# Patient Record
Sex: Male | Born: 1980 | Race: White | Hispanic: No | Marital: Single | Smoking: Current every day smoker
Health system: Southern US, Community
[De-identification: ages and names within clinical notes are randomized; demographics above are authoritative.]

## PROBLEM LIST (undated history)

## (undated) DIAGNOSIS — K589 Irritable bowel syndrome without diarrhea: Secondary | ICD-10-CM

## (undated) DIAGNOSIS — I10 Essential (primary) hypertension: Secondary | ICD-10-CM

---

## 2004-05-10 ENCOUNTER — Emergency Department: Payer: Self-pay | Admitting: Emergency Medicine

## 2004-05-16 ENCOUNTER — Emergency Department: Payer: Self-pay | Admitting: Emergency Medicine

## 2004-06-16 ENCOUNTER — Emergency Department: Payer: Self-pay | Admitting: General Practice

## 2004-11-30 ENCOUNTER — Emergency Department: Payer: Self-pay | Admitting: Emergency Medicine

## 2005-02-02 ENCOUNTER — Emergency Department: Payer: Self-pay | Admitting: Emergency Medicine

## 2005-03-08 ENCOUNTER — Emergency Department: Payer: Self-pay | Admitting: Emergency Medicine

## 2005-03-08 ENCOUNTER — Other Ambulatory Visit: Payer: Self-pay

## 2006-06-01 ENCOUNTER — Emergency Department: Payer: Self-pay | Admitting: General Practice

## 2006-09-04 ENCOUNTER — Emergency Department: Payer: Self-pay | Admitting: Emergency Medicine

## 2006-09-04 ENCOUNTER — Inpatient Hospital Stay (HOSPITAL_COMMUNITY): Admission: AD | Admit: 2006-09-04 | Discharge: 2006-09-08 | Payer: Self-pay | Admitting: Psychiatry

## 2006-09-04 ENCOUNTER — Ambulatory Visit: Payer: Self-pay | Admitting: *Deleted

## 2010-09-16 ENCOUNTER — Emergency Department: Payer: Self-pay | Admitting: Emergency Medicine

## 2011-11-09 ENCOUNTER — Emergency Department: Payer: Self-pay | Admitting: Emergency Medicine

## 2013-01-17 ENCOUNTER — Emergency Department: Payer: Self-pay | Admitting: Emergency Medicine

## 2013-01-24 ENCOUNTER — Emergency Department: Payer: Self-pay | Admitting: Emergency Medicine

## 2013-06-29 ENCOUNTER — Emergency Department: Payer: Self-pay | Admitting: Emergency Medicine

## 2013-06-29 LAB — COMPREHENSIVE METABOLIC PANEL
Albumin: 3.9 g/dL (ref 3.4–5.0)
Anion Gap: 4 — ABNORMAL LOW (ref 7–16)
Bilirubin,Total: 0.7 mg/dL (ref 0.2–1.0)
Calcium, Total: 8.8 mg/dL (ref 8.5–10.1)
Creatinine: 1.03 mg/dL (ref 0.60–1.30)
EGFR (African American): >60
EGFR (Non-African Amer.): >60
Osmolality: 274 (ref 275–301)
SGPT (ALT): 25 U/L (ref 12–78)
Sodium: 137 mmol/L (ref 136–145)

## 2013-06-29 LAB — CBC
HCT: 46 % (ref 40.0–52.0)
HGB: 16.2 g/dL (ref 13.0–18.0)
MCH: 29.6 pg (ref 26.0–34.0)
MCHC: 35.1 g/dL (ref 32.0–36.0)
MCV: 84 fL (ref 80–100)
Platelet: 220 10*3/uL (ref 150–440)
RBC: 5.46 10*6/uL (ref 4.40–5.90)
RDW: 13.6 % (ref 11.5–14.5)

## 2013-06-29 LAB — URINALYSIS, COMPLETE
Bilirubin,UR: NEGATIVE
Blood: NEGATIVE
Glucose,UR: NEGATIVE mg/dL (ref 0–75)
Nitrite: NEGATIVE
Ph: 5 (ref 4.5–8.0)
Protein: NEGATIVE
Specific Gravity: 1.021 (ref 1.003–1.030)
Squamous Epithelial: NONE SEEN

## 2013-11-27 ENCOUNTER — Emergency Department: Payer: Self-pay | Admitting: Internal Medicine

## 2013-11-27 LAB — URINALYSIS, COMPLETE
BACTERIA: NONE SEEN
Bilirubin,UR: NEGATIVE
Blood: NEGATIVE
Glucose,UR: NEGATIVE mg/dL (ref 0–75)
Ketone: NEGATIVE
Leukocyte Esterase: NEGATIVE
Nitrite: NEGATIVE
Ph: 5 (ref 4.5–8.0)
Protein: NEGATIVE
SQUAMOUS EPITHELIAL: NONE SEEN
Specific Gravity: 1.029 (ref 1.003–1.030)

## 2014-07-28 ENCOUNTER — Ambulatory Visit: Payer: Self-pay | Admitting: Family Medicine

## 2014-07-28 LAB — URINALYSIS, COMPLETE
BACTERIA: NEGATIVE
Bilirubin,UR: NEGATIVE
Blood: NEGATIVE
Glucose,UR: NEGATIVE
KETONE: NEGATIVE
LEUKOCYTE ESTERASE: NEGATIVE
Nitrite: NEGATIVE
Ph: 6 (ref 5.0–8.0)
Protein: NEGATIVE
Specific Gravity: 1.025 (ref 1.000–1.030)
Squamous Epithelial: NONE SEEN

## 2015-06-22 IMAGING — CT CT ABD-PELV W/ CM
2 of 4 series · 16 of 46 positions shown, 18 images · IV contrast (isovue)
Comparison: None.

CLINICAL DATA: Bilateral flank/lower abdominal pain, diarrhea

EXAM:
CT ABDOMEN AND PELVIS WITH CONTRAST
TECHNIQUE: Multidetector CT imaging of the abdomen and pelvis was performed
using the standard protocol following bolus administration of
intravenous contrast.
CONTRAST:  125 mL Isovue 370 IV

[Series 2: routine abd pel with · axial · 0.73mm/px · z∈[-418,+7]mm · 13 of 95 slices shown, 15 images]
[im 5/95  soft-tissue]
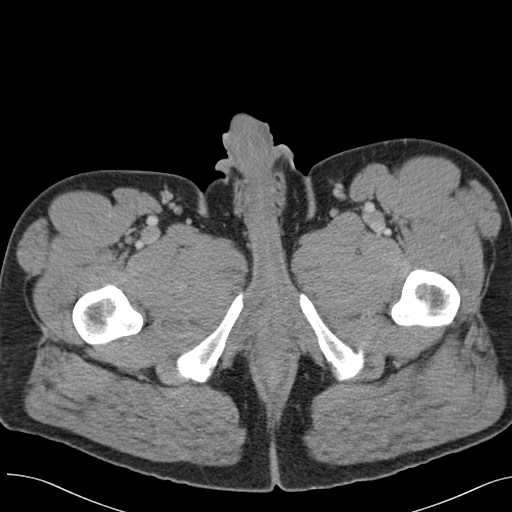
[im 5/95  bone]
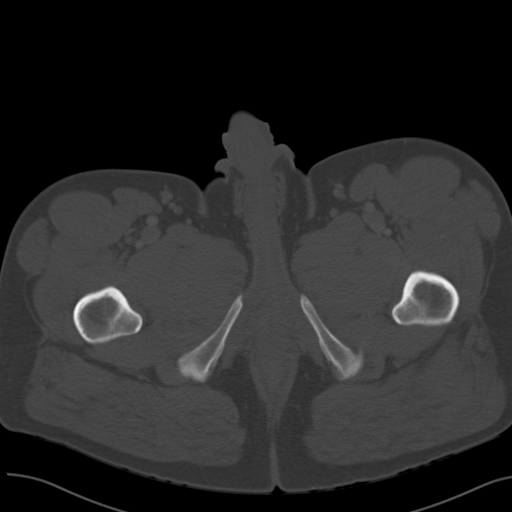
[im 13/95  soft-tissue]
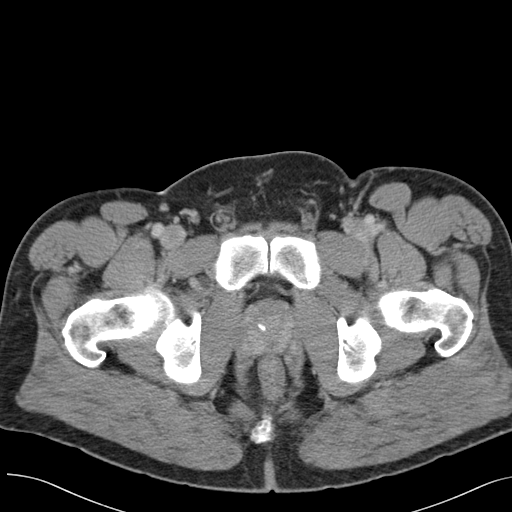
[im 21/95  soft-tissue]
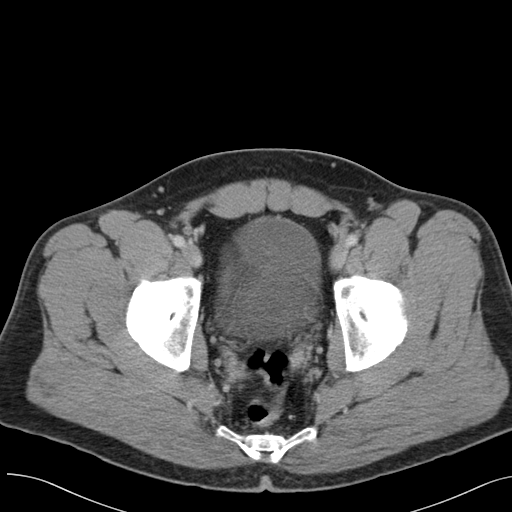
[im 25/95  soft-tissue]
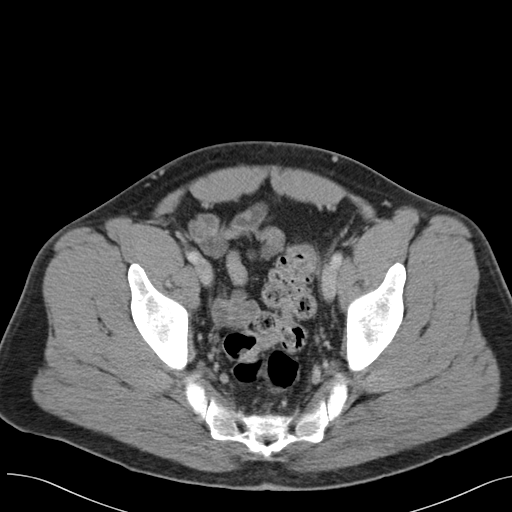
[im 33/95  soft-tissue]
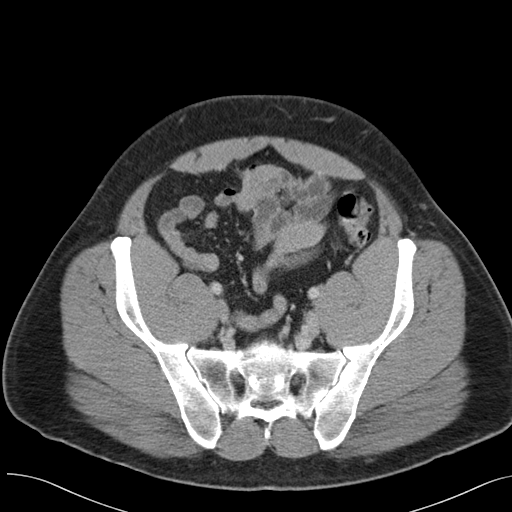
[im 41/95  soft-tissue]
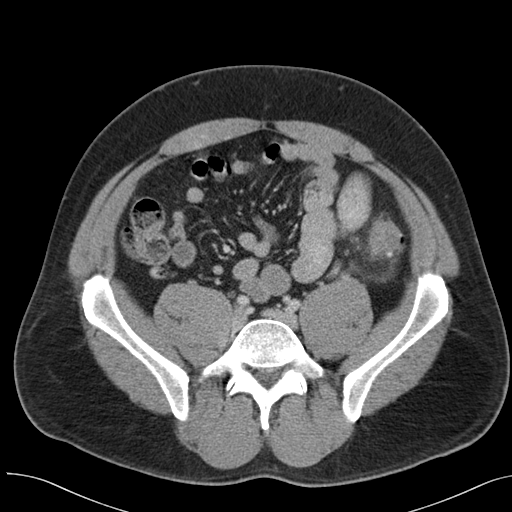
[im 50/95  soft-tissue]
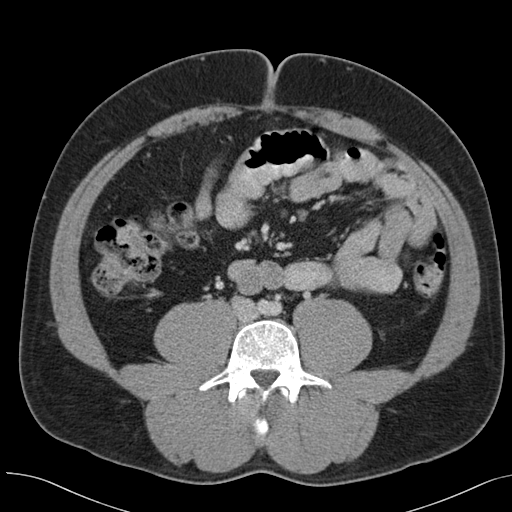
[im 54/95  soft-tissue]
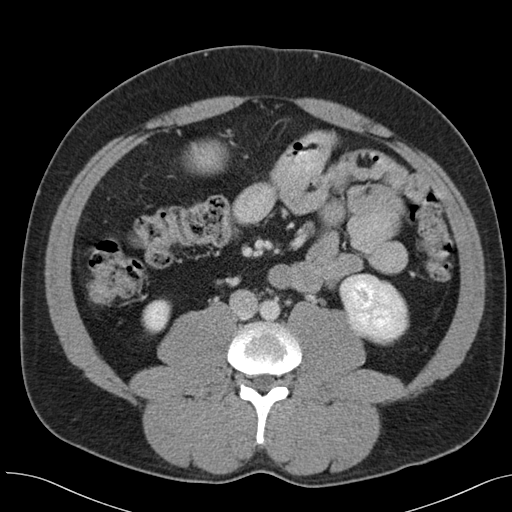
[im 62/95  soft-tissue]
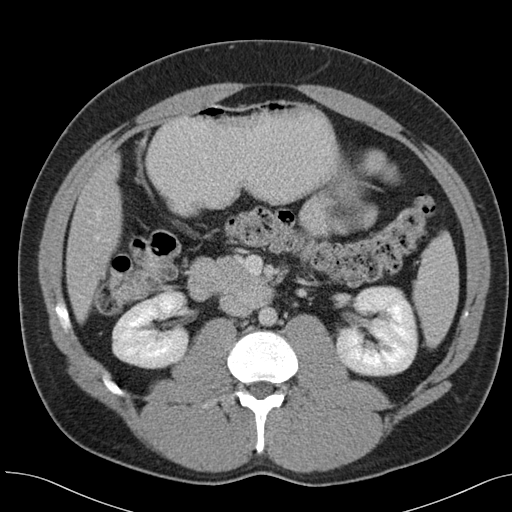
[im 62/95  bone]
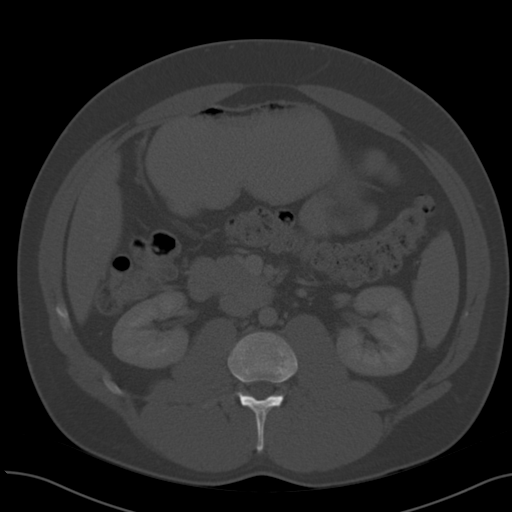
[im 70/95  soft-tissue]
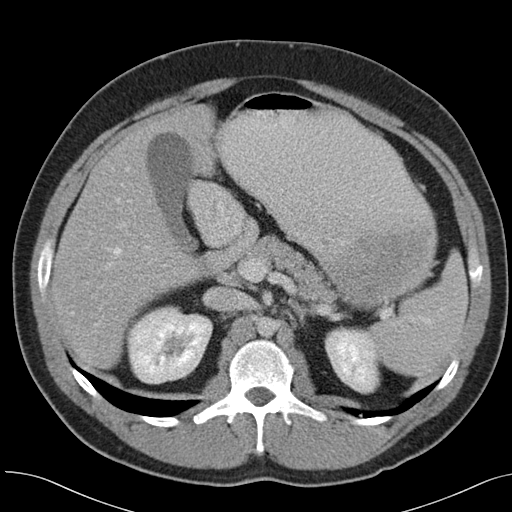
[im 74/95  soft-tissue]
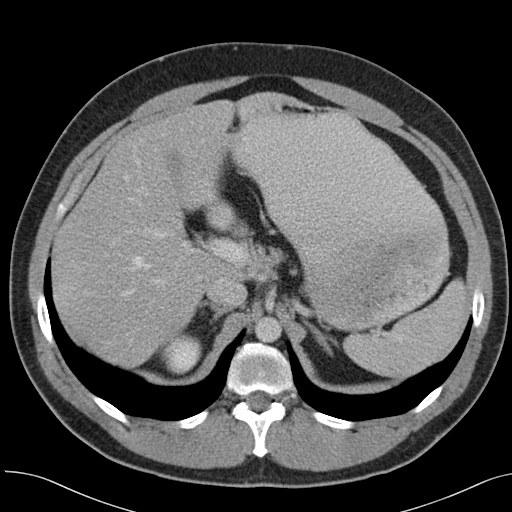
[im 82/95  soft-tissue]
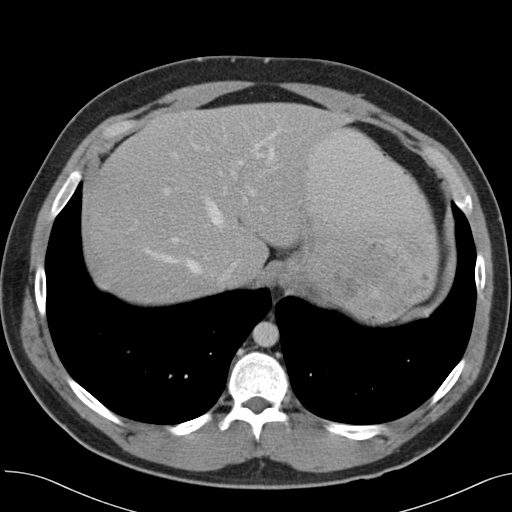
[im 90/95  soft-tissue]
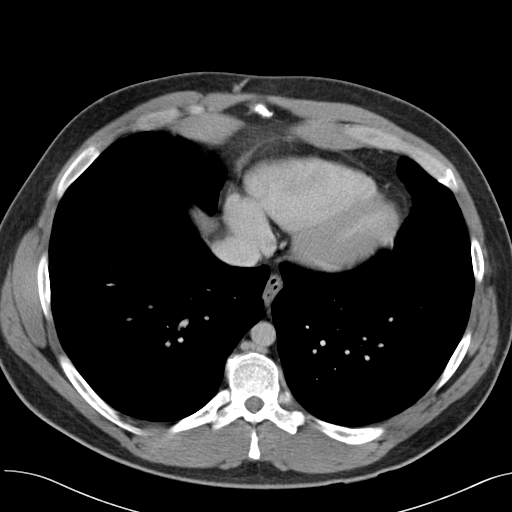

[Series 5: cor routine abd pel with · coronal · 0.70mm/px · 3 of 166 slices shown]
[im 56/166  soft-tissue]
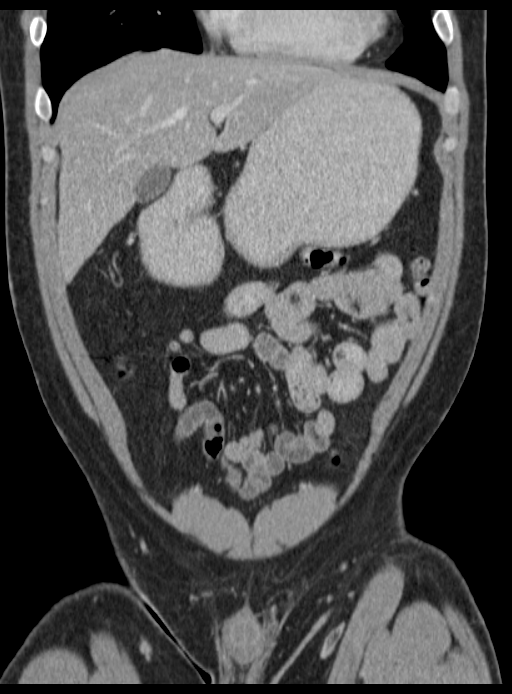
[im 74/166  soft-tissue]
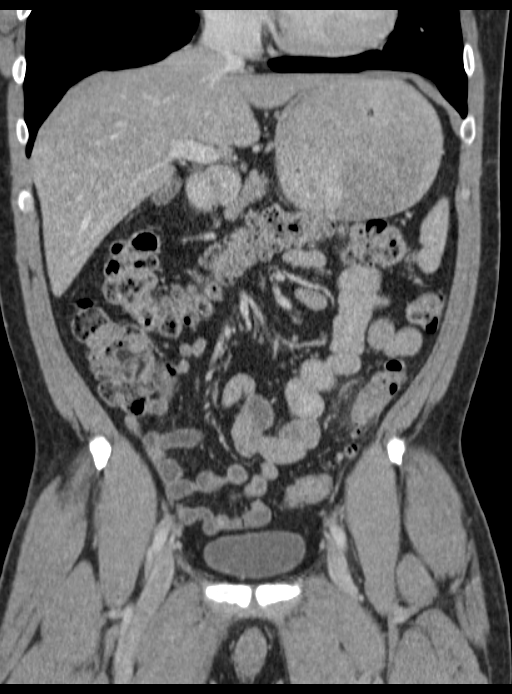
[im 92/166  soft-tissue]
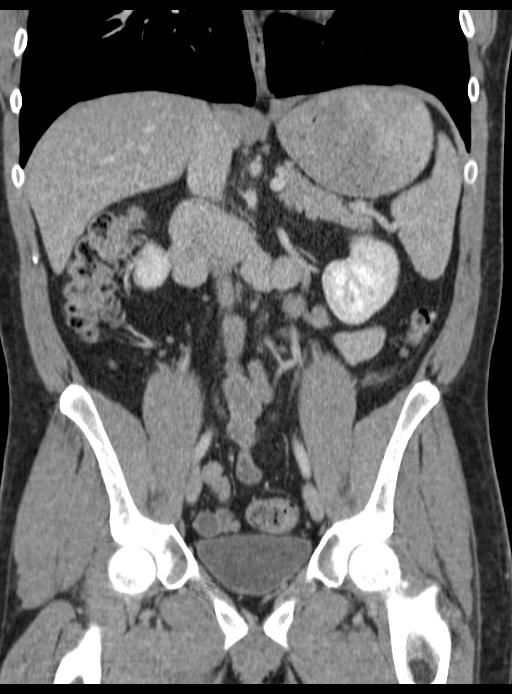

[16 of 46 positions shown; findings below may reference images not displayed]

FINDINGS: Lung bases are clear.

Liver, spleen, pancreas, and adrenal glands are within normal
limits.

Gallbladder is within normal limits. No intrahepatic or extrahepatic
ductal dilatation.

Kidneys are within normal limits.  No hydronephrosis.

No evidence of bowel obstruction. Normal appendix. Colonic
diverticulosis with mild inflammatory changes/stranding along the
proximal sigmoid colon (series 2/image 56), suspicious for mild
sigmoid diverticulitis.

No drainable fluid collection/abscess.  No free air.

No evidence of abdominal aortic aneurysm.

No abdominopelvic ascites.

No suspicious abdominopelvic lymphadenopathy.

Prostate is unremarkable.

Bladder is within normal limits.

Visualized osseous structures are within normal limits.
IMPRESSION: Suspected mild sigmoid diverticulitis.

No drainable fluid collection/abscess.  No free air.

## 2015-07-02 ENCOUNTER — Ambulatory Visit
Admission: EM | Admit: 2015-07-02 | Discharge: 2015-07-02 | Disposition: A | Discharge status: Home / Self Care | Payer: Self-pay | Attending: Emergency Medicine | Admitting: Emergency Medicine

## 2015-07-02 ENCOUNTER — Ambulatory Visit (INDEPENDENT_AMBULATORY_CARE_PROVIDER_SITE_OTHER): Payer: Self-pay

## 2015-07-02 DIAGNOSIS — R109 Unspecified abdominal pain: Secondary | ICD-10-CM

## 2015-07-02 HISTORY — DX: Irritable bowel syndrome without diarrhea: K58.9

## 2015-07-02 HISTORY — DX: Essential (primary) hypertension: I10

## 2015-07-02 LAB — URINALYSIS COMPLETE WITH MICROSCOPIC (ARMC ONLY)
Bacteria, UA: NONE SEEN
Bilirubin Urine: NEGATIVE
Glucose, UA: NEGATIVE mg/dL
Hgb urine dipstick: NEGATIVE
Leukocytes, UA: NEGATIVE
Nitrite: NEGATIVE
Protein, ur: NEGATIVE mg/dL
RBC / HPF: NONE SEEN RBC/hpf (ref 0–5)
Specific Gravity, Urine: 1.02 (ref 1.005–1.030)
pH: 7 (ref 5.0–8.0)

## 2015-07-02 MED ORDER — ONDANSETRON 4 MG PO TBDP
4.0000 mg | ORAL_TABLET | Freq: Once | ORAL | Status: AC
  Administered 2015-07-02: 4 mg via ORAL

## 2015-07-02 NOTE — ED Notes (Signed)
States started suddenly last evening with right sided abdominal pain/mid abdominal pain. + abdominal distention and last BM yesterday and was very constipated. + nausea. No vomiting. Denies fever

## 2015-07-02 NOTE — ED Provider Notes (Signed)
[facility]  ____________________________________________  Time seen: Approximately 1530 PM  I have reviewed the triage vital signs and the nursing notes.   HISTORY  Chief Complaint Abdominal Pain   HPI Logan Lowe is a 34 y.o. male presents for the complaint of right-sided abdominal pain. Reports abdominal pain onset was yesterday evening. States that the pain started suddenly however has gradually worsened. Patient reports that he has been nauseated with a decreased appetite. States last vomited was yesterday and states that he feels like his abdomen is bloated. Patient reports pain to abdomen when palpated as well as when he moves and lift his right leg. Denies fall or trauma. Denies fevers. States occasionally the pain will wrap around to his right side and right back. Denies vomiting or diarrhea. Denies recent sick contacts. Denies dysuria. Patient reports that he does continue to pass flatus.  States current abdominal pain is to right side at 10 out of 10. States pain is a sharp and stabbing pain.  Patient does report he has a history of diverticulitis as well as IBS but reports that this pain is atypical for the abdominal pain in the past. Denies chest pain, shortness of breath, extremity pain, rash. Denies fall or trauma.    Past Medical History  Diagnosis Date  . IBS (irritable colon syndrome)   . Hypertension     There are no active problems to display for this patient.   History reviewed. No pertinent past surgical history.  No current outpatient prescriptions on file.  Allergies Mushroom extract complex; Bee pollen; and Penicillins  Family History  Problem Relation Age of Onset  . Heart failure Mother   . Hyperlipidemia Father     Social History Social History  Substance Use Topics  . Smoking status: Current Every Day Smoker -- 1.00 packs/day    Types: Cigars  . Smokeless tobacco: None  . Alcohol Use: No    Review of  Systems Constitutional: No fever/chills Eyes: No visual changes. ENT: No sore throat. Cardiovascular: Denies chest pain. Respiratory: Denies shortness of breath. Gastrointestinal: Positive abdominal pain.  No nausea, no vomiting.  No diarrhea.  No constipation. Genitourinary: Negative for dysuria. Musculoskeletal: Negative for back pain. Skin: Negative for rash. Neurological: Negative for headaches, focal weakness or numbness.  10-point ROS otherwise negative.  ____________________________________________   PHYSICAL EXAM:  VITAL SIGNS: ED Triage Vitals  Enc Vitals Group     BP 07/02/15 1419 144/90 mmHg     Pulse Rate 07/02/15 1419 104     Resp 07/02/15 1419 18     Temp 07/02/15 1419 98.1 F (36.7 C)     Temp Source 07/02/15 1419 Tympanic     SpO2 07/02/15 1419 98 %     Weight 07/02/15 1419 230 lb (104.327 kg)     Height 07/02/15 1419 5\' 9"  (1.753 m)     Head Cir --      Peak Flow --      Pain Score 07/02/15 1422 10     Pain Loc --      Pain Edu? --      Excl. in GC? --     Constitutional: Alert and oriented. Well appearing and in no acute distress. Eyes: Conjunctivae are normal. PERRL. EOMI. Head: Atraumatic.  Nose: No congestion/rhinnorhea.  Mouth/Throat: Mucous membranes are moist.   Neck: No stridor.  No cervical spine tenderness to palpation. Hematological/Lymphatic/Immunilogical: No cervical lymphadenopathy. Cardiovascular: Normal rate, regular rhythm. Grossly normal heart sounds.  Good peripheral circulation.  Respiratory: Normal respiratory effort.  No retractions. Lungs CTAB. No wheezes, rales or rhonchi. Good air movement. Gastrointestinal:  Normal Bowel sounds.  Diffuse left upper quadrant and left lower quadrant abdominal pain, moderate to severe right upper quadrant and right lower quadrant abdominal pain, guarding right abdomen, mild right lower rebound tenderness, positive obturator sign. Minimal small areas yellowish healing ecchymosis present to mid  upper abdomen. Musculoskeletal: No lower or upper extremity tenderness nor edema.  No joint effusions. Bilateral pedal pulses equal and easily palpated.  Neurologic:  Normal speech and language. No gross focal neurologic deficits are appreciated. No gait instability. Skin:  Skin is warm, dry and intact. No rash noted. Psychiatric: Mood and affect are normal. Speech and behavior are normal.  ____________________________________________   LABS (all labs ordered are listed, but only abnormal results are displayed)  Labs Reviewed  URINALYSIS COMPLETEWITH MICROSCOPIC [facility] ONLY)    RADIOLOGY  EXAM: ABDOMEN - 1 VIEW  COMPARISON: CT abdomen 06/29/2013  FINDINGS: Normal bowel gas pattern. Expected amount of stool in the colon.  Negative for urinary tract calculi.  No significant skeletal abnormality.  IMPRESSION: Negative.   Electronically Signed By: Marlan Palau M.D. On: 07/02/2015 15:11  I, Renford Dills, personally viewed and evaluated these images (plain radiographs) as part of my medical decision making.     INITIAL IMPRESSION / ASSESSMENT AND PLAN / ED COURSE  Pertinent labs & imaging results that were available during my care of the patient were reviewed by me and considered in my medical decision making (see chart for details).  Well-appearing patient however patient does appear to be in pain. Ambulatory in urgent care. Patient guarding right abdomen. Patient diffuse abdominal tenderness with moderate right upper quadrant and right lower quadrant abdominal pain with positive obturator sign. RN ordered KUB post triage, KUB per radiologist revealed normal gas pattern, expected amount of stool in the colon, and otherwise negative.   As patient with increased pain that has continued recommend patient for further evaluation and likely by CT to evaluate abdominal pain. Concern for appendicitis. Do not have CT available at this facility at this time. 4 mg ODT Zofran  given in urgent care. Discussed patient and spouse at bedside to recommend further evaluation at emergency room of their choice. Patient and spouse report that they will proceed to [facility] ER, RN to call report. Counseled patient to remain nothing by mouth. Spouse to drive patient. Patient alert and oriented with decisional capacity and states that he would prefer to go by private vehicle. Patient stable at the time of discharge.  Discussed follow up with Primary care physician this week. Discussed follow up and return parameters including no resolution or any worsening concerns. Patient verbalized understanding and agreed to plan.    ____________________________________________   FINAL CLINICAL IMPRESSION(S) / ED DIAGNOSES  Final diagnoses:  Right sided abdominal pain       Renford Dills, NP 07/02/15 1629  Renford Dills, NP 07/02/15 1643

## 2015-07-02 NOTE — Discharge Instructions (Signed)
As discussed go directly to emergency room of your choice. Do not eat or drink anything and that timeframe.  Follow-up with primary care physician as needed. Return to urgent care as needed for new or worsening concerns.  Abdominal Pain, Adult Many things can cause abdominal pain. Usually, abdominal pain is not caused by a disease and will improve without treatment. It can often be observed and treated at home. Your health care provider will do a physical exam and possibly order blood tests and X-rays to help determine the seriousness of your pain. However, in many cases, more time must pass before a clear cause of the pain can be found. Before that point, your health care provider may not know if you need more testing or further treatment. HOME CARE INSTRUCTIONS Monitor your abdominal pain for any changes. The following actions may help to alleviate any discomfort you are experiencing:  Only take over-the-counter or prescription medicines as directed by your health care provider.  Do not take laxatives unless directed to do so by your health care provider.  Try a clear liquid diet (broth, tea, or water) as directed by your health care provider. Slowly move to a bland diet as tolerated. SEEK MEDICAL CARE IF:  You have unexplained abdominal pain.  You have abdominal pain associated with nausea or diarrhea.  You have pain when you urinate or have a bowel movement.  You experience abdominal pain that wakes you in the night.  You have abdominal pain that is worsened or improved by eating food.  You have abdominal pain that is worsened with eating fatty foods.  You have a fever. SEEK IMMEDIATE MEDICAL CARE IF:  Your pain does not go away within 2 hours.  You keep throwing up (vomiting).  Your pain is felt only in portions of the abdomen, such as the right side or the left lower portion of the abdomen.  You pass bloody or black tarry stools. MAKE SURE YOU:  Understand these  instructions.  Will watch your condition.  Will get help right away if you are not doing well or get worse.   This information is not intended to replace advice given to you by your health care provider. Make sure you discuss any questions you have with your health care provider.   Document Released: 04/27/2005 Document Revised: 04/08/2015 Document Reviewed: 03/27/2013 Else[employer] Interactive Patient Education [employer]2016 [employer]

## 2017-06-24 IMAGING — CR DG ABDOMEN 1V
2 series · 2 of 2 positions shown · non-contrast
Comparison: CT abdomen 06/29/2013

CLINICAL DATA: Right flank pain and constipation

EXAM:
ABDOMEN - 1 VIEW

[abdomen kub (1 of 2)]
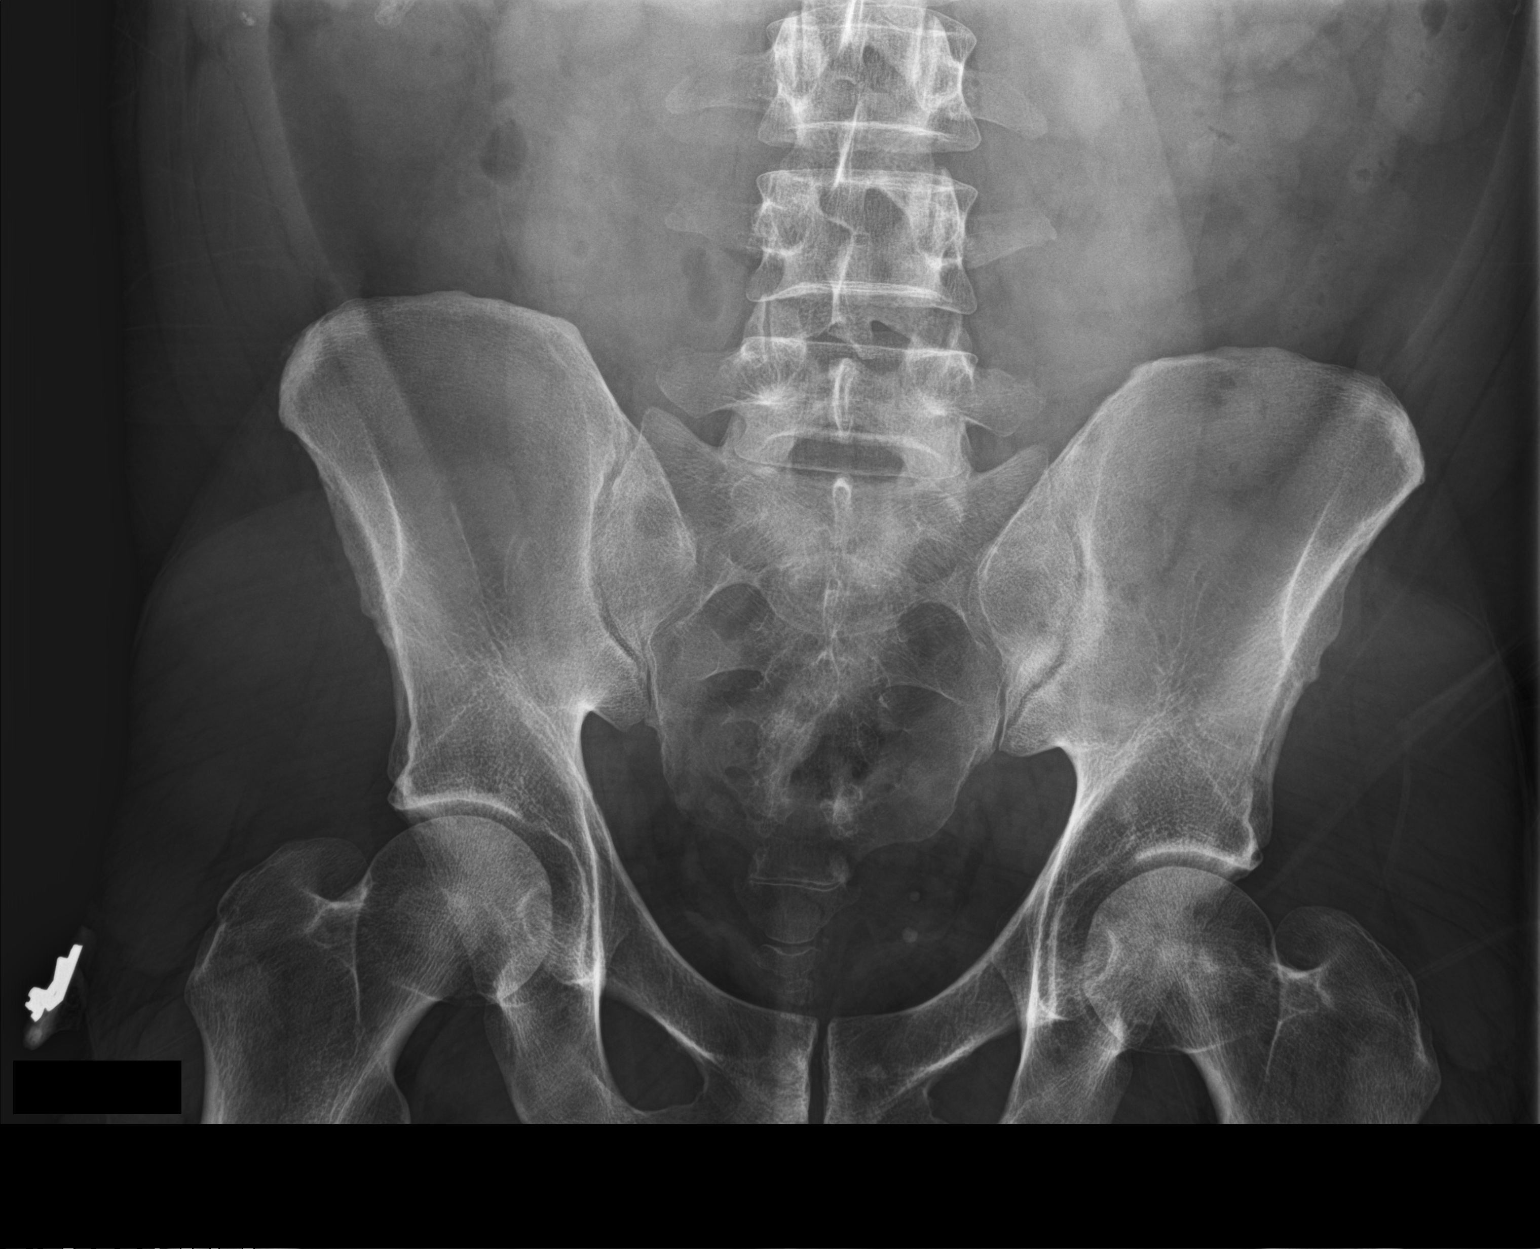

[abdomen kub (2 of 2)]
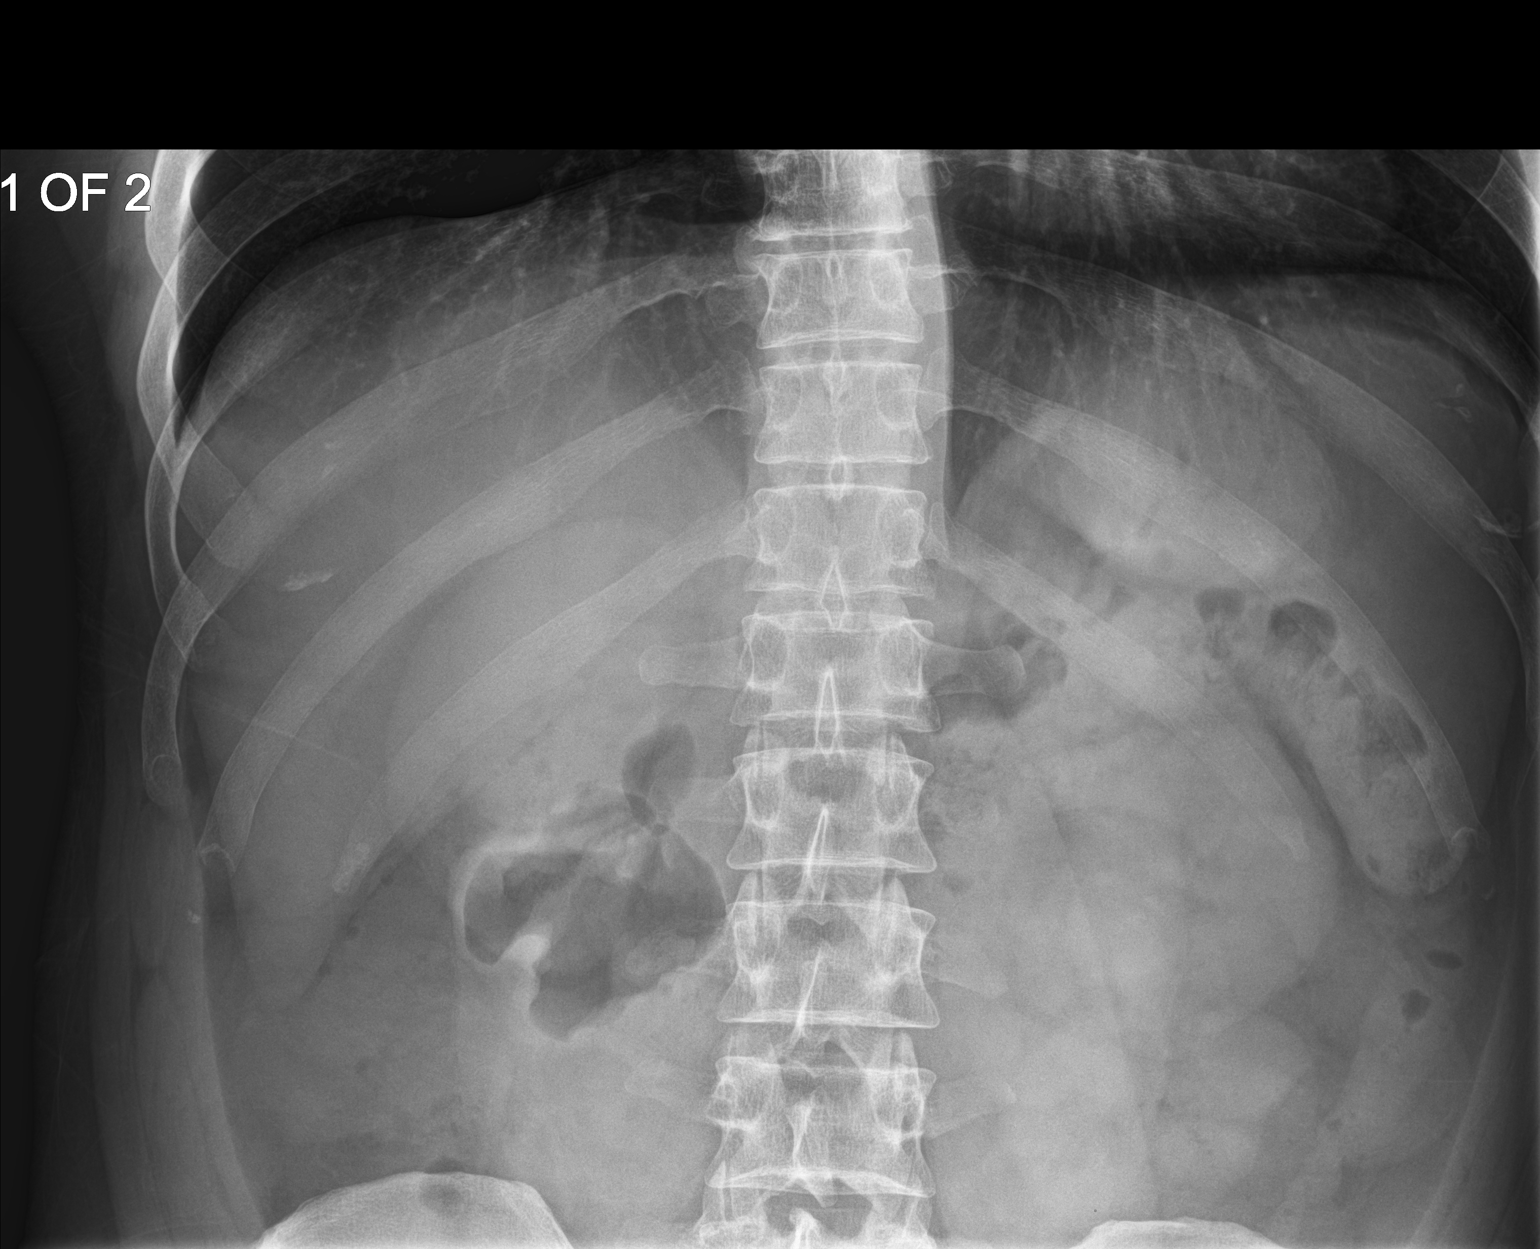

[2 of 2 positions shown; findings below may reference images not displayed]

FINDINGS: Normal bowel gas pattern.  Expected amount of stool in the colon.

Negative for urinary tract calculi.

No significant skeletal abnormality.
IMPRESSION: Negative.
